# Patient Record
Sex: Female | Born: 1985 | Race: White | Hispanic: No | Marital: Married | Smoking: Current every day smoker
Health system: Southern US, Community
[De-identification: ages and names within clinical notes are randomized; demographics above are authoritative.]

## PROBLEM LIST (undated history)

## (undated) ENCOUNTER — Ambulatory Visit: Admission: EM | Payer: BC Managed Care – PPO | Source: Home / Self Care

## (undated) HISTORY — PX: CHOLECYSTECTOMY: SHX55

---

## 2008-01-04 ENCOUNTER — Emergency Department: Payer: Self-pay | Admitting: Internal Medicine

## 2008-07-03 ENCOUNTER — Emergency Department: Payer: Self-pay | Admitting: Internal Medicine

## 2009-04-10 ENCOUNTER — Emergency Department: Payer: Self-pay | Admitting: Emergency Medicine

## 2009-09-15 ENCOUNTER — Emergency Department: Payer: Self-pay | Admitting: Emergency Medicine

## 2010-09-29 ENCOUNTER — Emergency Department: Payer: Self-pay | Admitting: Unknown Physician Specialty

## 2012-05-18 ENCOUNTER — Emergency Department: Payer: Self-pay | Admitting: Emergency Medicine

## 2012-05-18 LAB — PREGNANCY, URINE: Pregnancy Test, Urine: NEGATIVE m[IU]/mL

## 2013-01-09 ENCOUNTER — Ambulatory Visit: Payer: Self-pay | Admitting: Advanced Practice Midwife

## 2013-02-27 ENCOUNTER — Observation Stay: Payer: Self-pay | Admitting: Obstetrics & Gynecology

## 2013-02-27 LAB — FETAL FIBRONECTIN
Appearance: NORMAL
Fetal Fibronectin: NEGATIVE

## 2013-03-26 ENCOUNTER — Observation Stay: Payer: Self-pay

## 2013-04-17 ENCOUNTER — Observation Stay: Payer: Self-pay | Admitting: Obstetrics and Gynecology

## 2013-05-01 ENCOUNTER — Emergency Department: Payer: Self-pay | Admitting: Emergency Medicine

## 2013-06-01 ENCOUNTER — Observation Stay: Payer: Self-pay | Admitting: Obstetrics & Gynecology

## 2013-06-07 ENCOUNTER — Observation Stay: Payer: Self-pay

## 2013-06-07 ENCOUNTER — Inpatient Hospital Stay: Payer: Self-pay | Admitting: Obstetrics and Gynecology

## 2013-06-07 LAB — CBC WITH DIFFERENTIAL/PLATELET
Basophil #: 0 10*3/uL (ref 0.0–0.1)
Eosinophil #: 0.1 10*3/uL (ref 0.0–0.7)
HGB: 12.6 g/dL (ref 12.0–16.0)
Lymphocyte #: 1.9 10*3/uL (ref 1.0–3.6)
Lymphocyte %: 13.5 %
MCH: 28.9 pg (ref 26.0–34.0)
MCHC: 33.9 g/dL (ref 32.0–36.0)
MCV: 85 fL (ref 80–100)
Neutrophil #: 10.9 10*3/uL — ABNORMAL HIGH (ref 1.4–6.5)
Neutrophil %: 76.7 %
Platelet: 210 10*3/uL (ref 150–440)
RBC: 4.36 10*6/uL (ref 3.80–5.20)
WBC: 14.3 10*3/uL — ABNORMAL HIGH (ref 3.6–11.0)

## 2013-06-10 LAB — HEMATOCRIT: HCT: 32.5 % — ABNORMAL LOW (ref 35.0–47.0)

## 2013-06-23 ENCOUNTER — Emergency Department: Payer: Self-pay | Admitting: Emergency Medicine

## 2013-07-27 ENCOUNTER — Emergency Department: Payer: Self-pay | Admitting: Emergency Medicine

## 2013-09-14 ENCOUNTER — Emergency Department: Payer: Self-pay | Admitting: Emergency Medicine

## 2013-09-14 LAB — COMPREHENSIVE METABOLIC PANEL
Albumin: 3.5 g/dL (ref 3.4–5.0)
Alkaline Phosphatase: 189 U/L — ABNORMAL HIGH (ref 50–136)
Anion Gap: 5 — ABNORMAL LOW (ref 7–16)
BUN: 3 mg/dL — ABNORMAL LOW (ref 7–18)
Bilirubin,Total: 3.1 mg/dL — ABNORMAL HIGH (ref 0.2–1.0)
Chloride: 106 mmol/L (ref 98–107)
Co2: 26 mmol/L (ref 21–32)
Creatinine: 0.73 mg/dL (ref 0.60–1.30)
EGFR (African American): >60
EGFR (Non-African Amer.): >60
Glucose: 110 mg/dL — ABNORMAL HIGH (ref 65–99)
Osmolality: 271 (ref 275–301)
SGOT(AST): 133 U/L — ABNORMAL HIGH (ref 15–37)
SGPT (ALT): 177 U/L — ABNORMAL HIGH (ref 12–78)
Sodium: 137 mmol/L (ref 136–145)

## 2013-09-14 LAB — CBC
HCT: 40.4 % (ref 35.0–47.0)
HGB: 14.2 g/dL (ref 12.0–16.0)
MCHC: 35.2 g/dL (ref 32.0–36.0)
Platelet: 259 10*3/uL (ref 150–440)
RBC: 4.73 10*6/uL (ref 3.80–5.20)
RDW: 14.3 % (ref 11.5–14.5)
WBC: 9.1 10*3/uL (ref 3.6–11.0)

## 2013-09-14 LAB — URINALYSIS, COMPLETE: Ph: 6 (ref 4.5–8.0)

## 2013-09-14 LAB — LIPASE, BLOOD: Lipase: 283 U/L (ref 73–393)

## 2013-09-14 LAB — PREGNANCY, URINE: Pregnancy Test, Urine: NEGATIVE m[IU]/mL

## 2013-10-11 ENCOUNTER — Ambulatory Visit: Payer: Self-pay | Admitting: Surgery

## 2013-10-11 LAB — HEPATIC FUNCTION PANEL A (ARMC)
Bilirubin, Direct: <0.1 mg/dL (ref 0.00–0.20)
SGPT (ALT): 31 U/L (ref 12–78)
Total Protein: 7.5 g/dL (ref 6.4–8.2)

## 2013-10-11 LAB — POTASSIUM: Potassium: 3.3 mmol/L — ABNORMAL LOW (ref 3.5–5.1)

## 2013-10-15 ENCOUNTER — Ambulatory Visit: Payer: Self-pay | Admitting: Surgery

## 2013-10-16 LAB — PATHOLOGY REPORT

## 2014-01-09 ENCOUNTER — Emergency Department: Payer: Self-pay | Admitting: Emergency Medicine

## 2014-01-13 ENCOUNTER — Emergency Department: Payer: Self-pay | Admitting: Emergency Medicine

## 2014-01-14 ENCOUNTER — Emergency Department: Payer: Self-pay | Admitting: Emergency Medicine

## 2014-04-20 ENCOUNTER — Emergency Department: Payer: Self-pay | Admitting: Emergency Medicine

## 2015-01-08 IMAGING — US US OB US >=[ID] SNGL FETUS
1 series · 14 of 28 positions shown · non-contrast
Comparison: none

REASON FOR EXAM: For dates
COMMENTS:

[Series 1: us ob us >=(id) sngl fetus · 0.26mm/px · 14 of 78 slices shown]
[im 3/78]
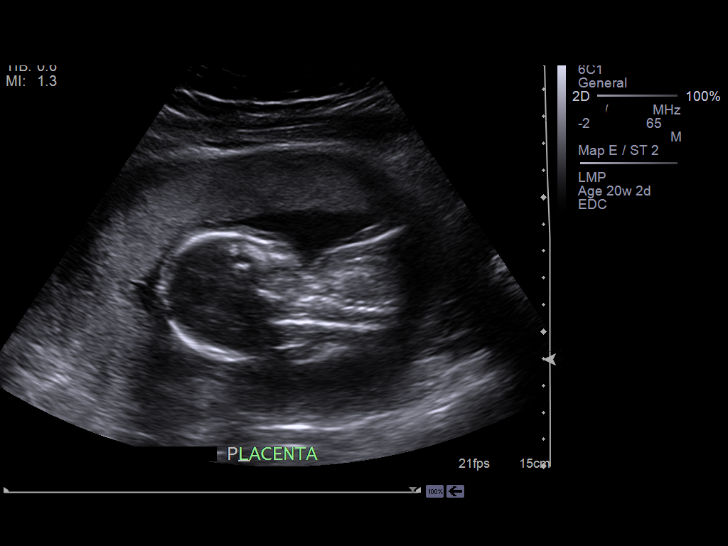
[im 9/78]
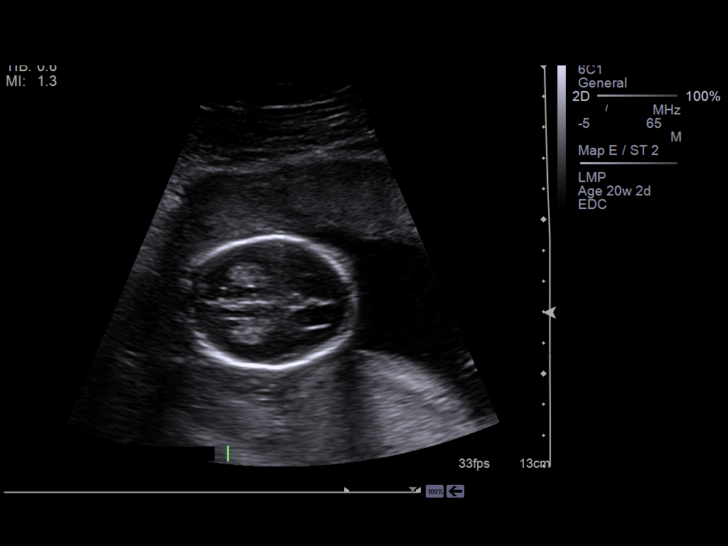
[im 15/78]
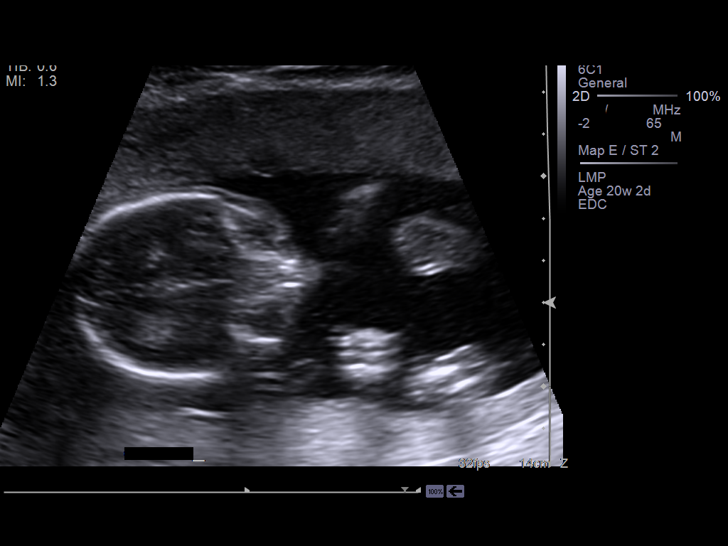
[im 20/78]
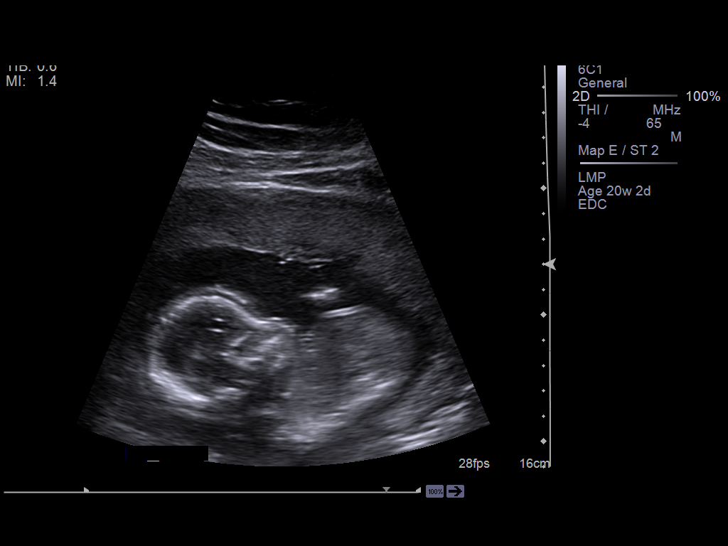
[im 26/78]
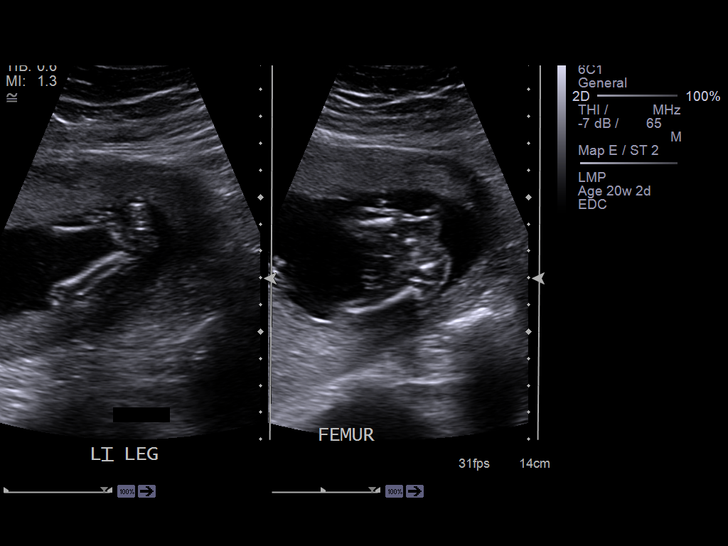
[im 32/78]
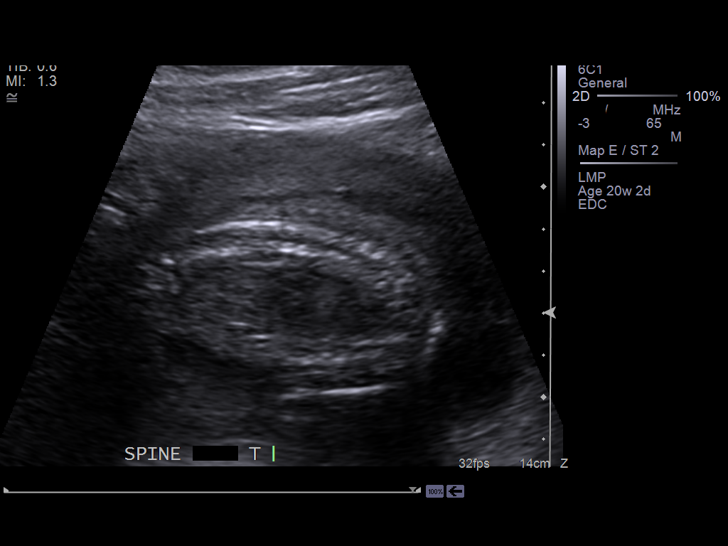
[im 38/78]
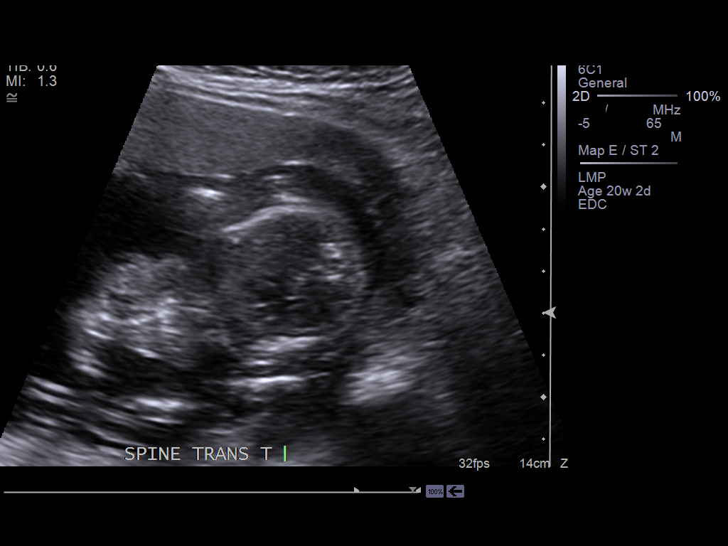
[im 43/78]
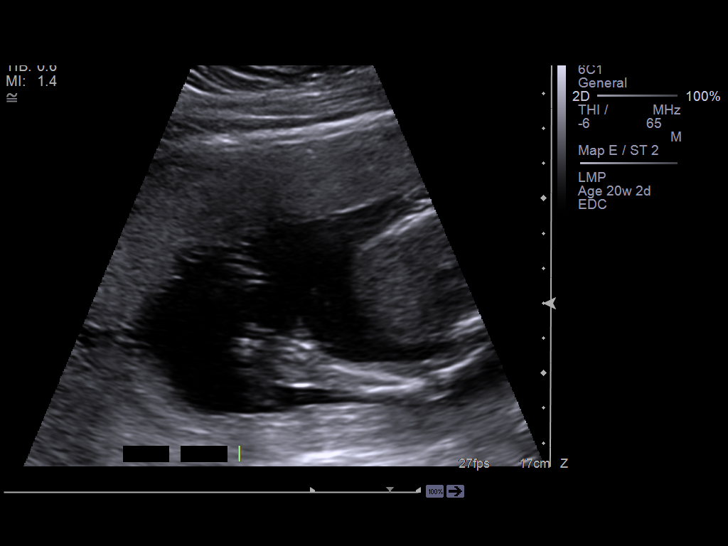
[im 49/78]
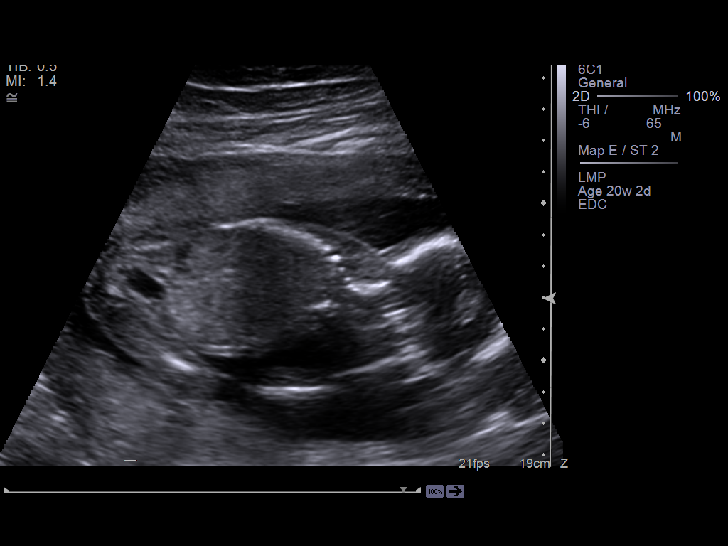
[im 55/78]
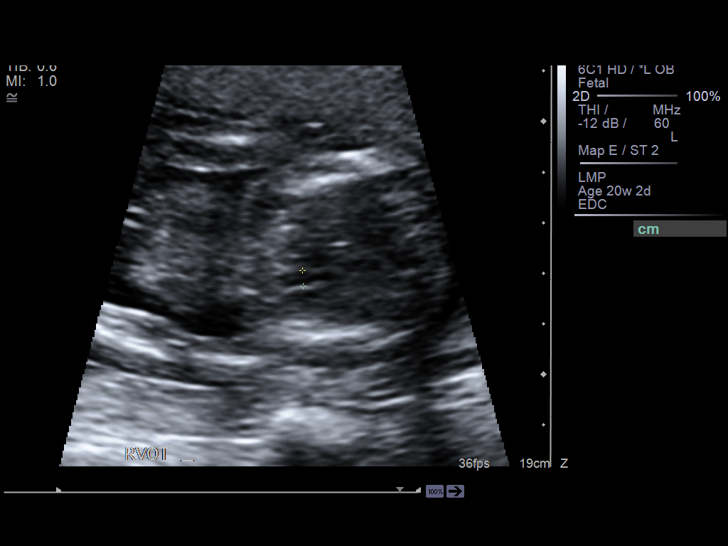
[im 60/78]
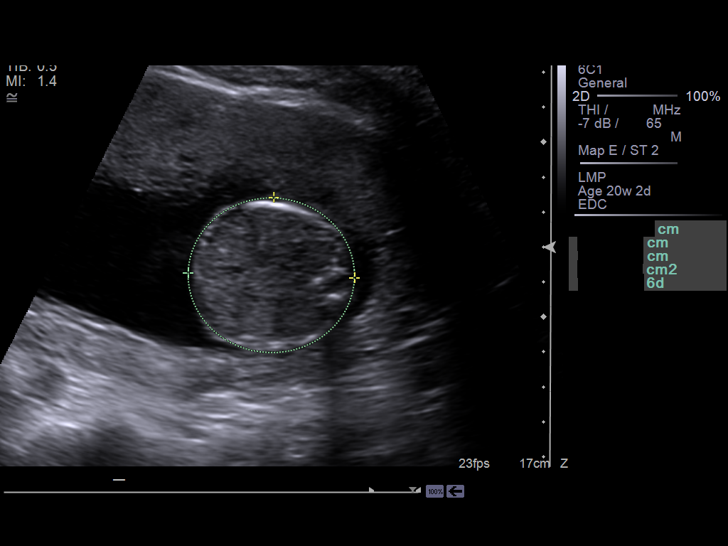
[im 66/78]
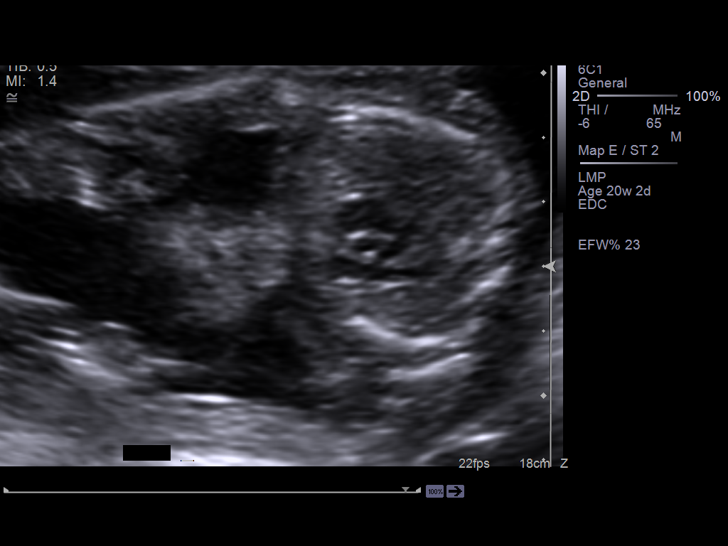
[im 72/78]
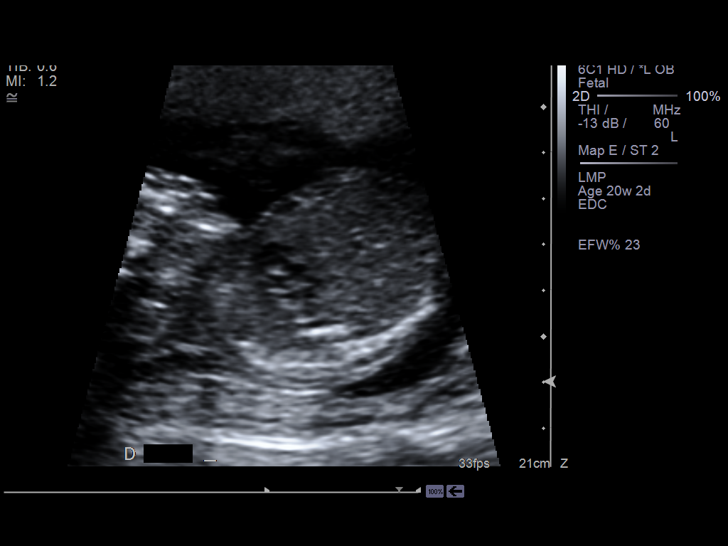
[im 78/78]
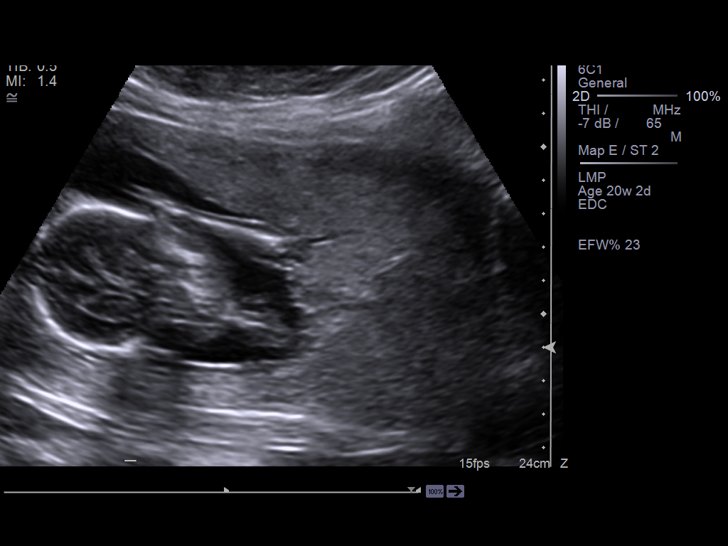

[14 of 28 positions shown; findings below may reference images not displayed]

PROCEDURE:     US  - US OB GREATER/OR EQUAL TO [id number]  - January 09, 2013  [DATE]

RESULT:     Single viable intrauterine pregnancy with active cardiac, trunk,
extremities, diaphragm movement. Fetal presentation is variable. Placenta is
anterior and 4.1 cm from the cervix. Amniotic fluid volume appears normal.
Fetal parts appear normal. If or there are any complicating features of this
pregnancy ultrasound at a tertiary care center suggested. Gestational age
parameters correlate well and correspond to a mean gestational age by
ultrasound of 19 weeks 5 days.
IMPRESSION: Single viable pregnancy at 19 weeks 5 days.

## 2015-04-11 NOTE — H&P (Signed)
PATIENT NAME:  Dana Mckinney, Dana Mckinney MR#:  [id number] DATE OF BIRTH:  1986/11/02  DATE OF ADMISSION:  10/15/2013  CHIEF COMPLAINT: Epigastric pain.   HISTORY OF PRESENT ILLNESS: This is a patient with recurrent episodes of right upper quadrant pain, associated with fatty food intolerance, fairly classic symptoms of gallbladder disease with biliary colic. She has had nausea, fatty food intolerance, no jaundice or acholic stools. Her liver function tests are within normal limits.   PAST MEDICAL HISTORY: None.   PAST SURGICAL HISTORY: None.   ALLERGIES: NONE.   MEDICATIONS: Oxycodone and OCP.   FAMILY HISTORY: Noncontributory.   SOCIAL HISTORY: The patient smokes and is obese.   REVIEW OF SYSTEMS: Ten-system review has been performed and negative with the exception of that mentioned in the history of present illness.   PHYSICAL EXAMINATION:  GENERAL: Obese female patient.  HEENT: No scleral icterus.  NECK: No palpable neck nodes.  CHEST: Clear to auscultation.  CARDIAC: Regular rate and rhythm.  ABDOMEN: Soft and nontender. No scars are noted.  EXTREMITIES: Without edema.  NEUROLOGIC: Grossly intact.  INTEGUMENT: No jaundice.   LABORATORY VALUES: Demonstrate normal liver function tests. Ultrasound shows stones.   ASSESSMENT AND PLAN: This is a patient with symptomatic cholelithiasis, here for elective laparoscopic cholecystectomy. I discussed with her the options of observation versus the risks of bleeding, infection, recurrence of symptoms, failure to resolve her symptoms, open procedure, bile duct damage, bile duct leak, retained common bile duct stone, any of which could require further surgery and/or ERCP, stent and papillotomy.   I also discussed with her her obesity, smoking and OCP use in conjunction with general anesthetic, and the risk for developing a pulmonary embolus or venous thrombosis. We will take appropriate action.  I have asked her to stop smoking, and will review that  with her in the preop holding area. She is at high risk for deep vein thrombosis and pleural effusion and will need prophylaxis. Prophylaxis has been ordered. She understood and agreed to proceed with this plan.   ____________________________ Adah Salvageichard E. Excell Seltzerooper, MD rec:cg D: 10/14/2013 20:46:44 ET T: 10/14/2013 23:48:41 ET JOB#: [id number]  cc: Adah Salvageichard E. Excell Seltzerooper, MD, <Dictator> Lattie HawICHARD E Riko Lumsden MD ELECTRONICALLY SIGNED 10/15/2013 7:42

## 2015-04-11 NOTE — Op Note (Signed)
PATIENT NAME:  Dana Mckinney, [name] MR#:  [id number] DATE OF BIRTH:  [birth year]  DATE OF PROCEDURE:  10/15/2013  PREOPERATIVE DIAGNOSIS: Symptomatic cholelithiasis.   POSTOPERATIVE DIAGNOSIS: Symptomatic cholelithiasis.   PROCEDURE: Laparoscopic cholecystectomy.   SURGEON: Dionne Miloichard Cooper, M.D.   ASSISTANT: Jules SchickBrittany James, PA-S.   INDICATIONS: This is a patient with recurrent epigastric and right upper quadrant pain associated with fatty food intolerance and workup showing gallstones. Preoperatively, we discussed rationale for surgery, the options of observation, risk of bleeding, infection, recurrence of symptoms, failure to resolve all of her symptoms, open procedure, bile duct damage, bile duct leak, retained common bile duct stone, any of which could require further surgery and/or ERCP, stent and papillotomy. This has all been reviewed for her in the preop holding area. She understood and agreed to proceed.   FINDINGS: Small gallstones, fairly large cystic duct.   DESCRIPTION OF PROCEDURE: The patient was induced to general anesthesia, given IV antibiotics. VTE prophylaxis was in place. She was prepped and draped in a sterile fashion. Marcaine was infiltrated in skin and subcutaneous tissues around the periumbilical area. An incision was made. Veress needle was placed. Pneumoperitoneum was obtained, and a 5 mm trocar port was placed. The abdominal cavity was explored, and under direct vision, a 10 mm epigastric port and 2 lateral 5 mm ports were placed. The gallbladder was placed on tension. The peritoneum over the infundibulum was incised bluntly. The cystic lymphatics were doubly clipped and divided. This allowed for good visualization of the cystic duct as I entered the infundibulum of the gallbladder. Small stones within the infundibulum were milked back away from the cystic duct infundibular junction. The cystic duct was doubly clipped and divided, and the gallbladder was taken from the gallbladder  fossa with electrocautery. The cystic artery was doubly clipped and divided, and a lateral vessel was doubly clipped and divided as well, and the gallbladder was passed out through the epigastric port site with the aid of an Endo Catch bag. The area was checked for hemostasis and found to be adequate. There was no sign of bleeding, bile leak or bowel injury. The camera was placed in the epigastric site. There was no sign of bowel injury or adhesions in the pelvis or near the periumbilical insertion site. Again, no sign of bleeding, bile leak or bowel injury; therefore, pneumoperitoneum was released. All ports were removed. Fascial edges at the epigastric site were approximated with 0 Vicryl figure-of-eight sutures, and then 4-0 subcuticular Monocryl was used on all skin edges. Steri-Strips, Mastisol and sterile dressings were placed.   The patient tolerated the procedure well. There were no complications. She was taken to the recovery room in stable condition to be discharged in the care of her family.  Follow up in 10 days.     ____________________________ Adah Salvageichard E. Excell Seltzerooper, MD rec:dmm D: 10/15/2013 14:10:20 ET T: 10/15/2013 21:31:25 ET JOB#: [id number]  cc: Adah Salvageichard E. Excell Seltzerooper, MD, <Dictator> Lattie HawICHARD E COOPER MD ELECTRONICALLY SIGNED 10/16/2013 9:02

## 2015-04-29 NOTE — H&P (Signed)
L&D Evaluation:  History Expanded:  HPI 29 yo G1 now 5634 2/[redacted]weeks gestation with EDC=05/27/2013 by LMP verified by US. Last night she had some pains in her lower abdomen along the pubic bone and she went to sleep and this morning went to work and she had some worsening pain through out the day. she had two days of diarrhea and a milky white DC. she read her book that said those are signs of labor and she was encouraged by her mom to come in. Denies vaginal bleeding or LOF. 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  A+, RPR NR, R has had the vaccines in 89 and 92,, HBsAg neg, GBS unk, glucola early was 94/ 106, VZI   Gravida 1   Term 0   PreTerm 0   Abortion 0   Living 0   Blood Type (Maternal) A positive   Group B Strep Results Maternal (Result >5wks must be treated as unknown) unknown/result > 5 weeks ago   Maternal HIV Negative   Maternal Syphilis Ab Nonreactive   Maternal Varicella Immune   Rubella Results (Maternal) had vaCCINES IN 89 AND 92   Maternal T-Dap Unknown   [facility]   Presents with abdominal pain   Patient's Medical History obesity, HSIL on colpo   Patient's Surgical History none   Medications Pre Natal Vitamins   Allergies NKDA   Social History tobacco   Family History Non-Contributory   ROS:  ROS see HPI   Exam:  Vital Signs stable  115/64   General no apparent distress   Mental Status clear   Chest clear   Heart normal sinus rhythm   Abdomen gravid, non-tender   Fetal Position ballotable-V?   Back no CVAT   Edema no edema   Pelvic no external lesions, cervix closed and thick   Mebranes Intact, AFI=12.1 cm   FHT baseline 135 with accels to 150s   FHT Description CAT 1   Ucx absent   Skin dry   Other milky dc wet prep normalflora   Impression:  Impression reactive NST, IUP at 34 2/7 weeks with reactive NST, pubic bone pain, discomforts of pregnancy   Plan:  Plan [facility]. Report decreased FM. FU at [facility] as scheduled   Comments Get belly band to support abdomen   Follow Up Appointment already scheduled. next monday   Electronic Signatures: Adria DevonKlett, Joffre Lucks (MD)  (Signed 29-Apr-14 20:19)  Authored: L&D Evaluation   Last Updated: 29-Apr-14 20:19 by Adria DevonKlett, Reda Gettis (MD)

## 2015-04-29 NOTE — H&P (Signed)
L&D Evaluation:  History:  HPI 29 yo G1 at @ 6917w7d gestational age by LMP.  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  She presents with en episode of vaginal spotting x 2 spots last night. Since early this morning she has been having some cramping symptoms in her bilateral sides.  She notes positive fetal movement, denies leakage of fluid.   A+, RPR NR, RI, HBsAg neg, GBS unk   Patient's Medical History obesity   Patient's Surgical History none   Medications Pre Natal Vitamins   Allergies NKDA   Social History tobacco   Family History Non-Contributory   ROS:  ROS All systems were reviewed.  HEENT, CNS, GI, GU, Respiratory, CV, Renal and Musculoskeletal systems were found to be normal., unless noted in HPI (Denies urinary, GI, vaginal symptoms, denies fevers, chills)   Exam:  Vital Signs stable   General no apparent distress   Mental Status clear   Chest clear   Heart normal sinus rhythm   Abdomen gravid, non-tender   Back no CVAT   Edema no edema   Pelvic no external lesions, cervix closed and thick   Mebranes Intact   Skin no lesions   Impression:  Impression abdominal cramping   Plan:  Plan UA   Comments - fFN - wet prep -UA   Electronic Signatures: Conard NovakJackson, Stephen D (MD)  (Signed 11-Mar-14 12:54)  Authored: L&D Evaluation   Last Updated: 11-Mar-14 12:54 by Conard NovakJackson, Stephen D (MD)

## 2015-04-29 NOTE — H&P (Signed)
L&D Evaluation:  History Expanded:  HPI 29 yo G1 with EDC=05/31/2013 by 19 week US, presents at 41 2/7 weeks for NST on 06/07/13. Set up for IOL on 6/21 pm. +FM. Denies vaginal bleeding or LOF. PNC at [facility]. 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  A+, RPR NR, RI, HBsAg neg, GBS positive   Group B Strep Results Maternal (Result >5wks must be treated as unknown) positive   Patient's Medical History obesity   Patient's Surgical History none   Medications Pre Natal Vitamins  Iron   Allergies NKDA   Social History tobacco  1 cig/day   Family History Non-Contributory   ROS:  ROS see HPI   Exam:  Vital Signs stable   General no apparent distress   Mental Status clear   Chest clear   Heart no murmur/gallop/rubs   Abdomen gravid, non-tender   Estimated Fetal Weight EFW: 8 lbs   Fetal Position vertex   Pelvic no external lesions, 1/long/-3   Mebranes Intact   FHT normal rate with no decels, category 1 tracing   Ucx absent   Skin dry   Impression:  Impression postdates   Plan:  Comments IOL scheduled for 6/21 at 8 pm. Unit will call pt if able to be scheduled sooner. Risks (tachysytole, FITL, C-section) and benefits (avoiding risks of post-dates pregnancy) of IOL discussed with pt  who agrees with plan for Cervidil cervical ripening and Pitocin or other methods prn. Labor precautions.   Electronic Signatures: Vella KohlerBrothers, Selda Jalbert K (CNM)  (Signed 19-Jun-14 08:44)  Authored: L&D Evaluation   Last Updated: 19-Jun-14 08:44 by Vella KohlerBrothers, Raimundo Corbit K (CNM)

## 2015-04-29 NOTE — H&P (Signed)
L&D Evaluation:  History:  HPI 29 yo G1 now 30 4/[redacted] weeks gestation with EDC=05/31/2013 by ultrasound sent from [facility] for NST due to decreased FM x 2 days. Denies vaginal bleeding or LOF. Seen one month ago in L&D for vaginal spotting. 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  A+, RPR NR, RI, HBsAg neg, GBS unk   Presents with decreased fetal movement   Patient's Medical History obesity   Patient's Surgical History none   Medications Pre Natal Vitamins   Allergies NKDA   Social History tobacco   Family History Non-Contributory   ROS:  ROS see HPI   Exam:  Vital Signs stable  115/64   General no apparent distress   Mental Status clear   Abdomen gravid, non-tender   Fetal Position breech on ultrasound with an anterior placenta   Mebranes Intact, AFI=12.1 cm   FHT baseline 135 with accels to 150s   FHT Description CAT 1   Ucx absent   Skin dry   Impression:  Impression IUP at 30 4/7 weeks with reactive NST, normal AFI   [facility]  [facility]. Report decreased FM. FU at [facility] as scheduled   Electronic Signatures: Trinna BalloonGutierrez, Jiana Lemaire L (CNM)  (Signed 07-Apr-14 15:59)  Authored: L&D Evaluation   Last Updated: 07-Apr-14 15:59 by Trinna BalloonGutierrez, Doron Shake L (CNM)

## 2015-12-16 ENCOUNTER — Emergency Department
Admission: EM | Admit: 2015-12-16 | Discharge: 2015-12-16 | Disposition: A | Discharge status: Home / Self Care | Payer: BLUE CROSS/BLUE SHIELD | Attending: Emergency Medicine | Admitting: Emergency Medicine

## 2015-12-16 DIAGNOSIS — F17210 Nicotine dependence, cigarettes, uncomplicated: Secondary | ICD-10-CM | POA: Insufficient documentation

## 2015-12-16 DIAGNOSIS — K029 Dental caries, unspecified: Secondary | ICD-10-CM | POA: Insufficient documentation

## 2015-12-16 DIAGNOSIS — K0889 Other specified disorders of teeth and supporting structures: Secondary | ICD-10-CM | POA: Diagnosis present

## 2015-12-16 MED ORDER — AMOXICILLIN 500 MG PO CAPS: 500.0000 mg | ORAL_CAPSULE | Freq: Three times a day (TID) | ORAL | Status: DC

## 2015-12-16 MED ORDER — TRAMADOL HCL 50 MG PO TABS: 50.0000 mg | ORAL_TABLET | Freq: Four times a day (QID) | ORAL | Status: DC | PRN

## 2015-12-16 MED ORDER — IBUPROFEN 800 MG PO TABS: 800.0000 mg | ORAL_TABLET | Freq: Three times a day (TID) | ORAL | Status: DC | PRN

## 2015-12-16 NOTE — ED Notes (Signed)
Pt c/o right upper tooth pain for several weeks, this morning woke with swelling to the right side of face

## 2015-12-16 NOTE — ED Notes (Signed)
Pt discharged home after verbalizing understanding of discharge instructions; nad noted. 

## 2015-12-16 NOTE — ED Provider Notes (Signed)
[facility] Emergency Department Provider Note  ____________________________________________  Time seen: Approximately 12:42 PM  I have reviewed the triage vital signs and the nursing notes.   HISTORY  Chief Complaint Dental Pain    HPI Dana Mckinney is a 29 y.o. female is complaining of dental pain secondary to devitalize teeth in the upper right molar area. Patient is scheduled for extractions January 2017 and a plate replacement. Patient states she is having dental pain for several weeks and the last 3 days she's nose increased swelling. Patient rates the pain as 8/10 described as sharp. No palliative measures taken for this complaint.   History reviewed. No pertinent past medical history.  There are no active problems to display for this patient.   Past Surgical History  Procedure Laterality Date  . Cholecystectomy      Current Outpatient Rx  Name  Route  Sig  Dispense  Refill  . amoxicillin (AMOXIL) 500 MG capsule   Oral   Take 1 capsule (500 mg total) by mouth 3 (three) times daily.   30 capsule   0   . ibuprofen (ADVIL,MOTRIN) 800 MG tablet   Oral   Take 1 tablet (800 mg total) by mouth every 8 (eight) hours as needed.   30 tablet   0   . traMADol (ULTRAM) 50 MG tablet   Oral   Take 1 tablet (50 mg total) by mouth every 6 (six) hours as needed.   20 tablet   0     Allergies Review of patient's allergies indicates no known allergies.  No family history on file.  Social History Social History  Substance Use Topics  . Smoking status: Current Every Day Smoker    Types: Cigarettes  . Smokeless tobacco: None  . Alcohol Use: No    Review of Systems Constitutional: No fever/chills Eyes: No visual changes. ENT: No sore throat. Dental pain Cardiovascular: Denies chest pain. Respiratory: Denies shortness of breath. Gastrointestinal: No abdominal pain.  No nausea, no vomiting.  No diarrhea.  No constipation. Genitourinary:  Negative for dysuria. Musculoskeletal: Negative for back pain. Skin: Negative for rash. Neurological: Negative for headaches, focal weakness or numbness. 10-point ROS otherwise negative.  ____________________________________________   PHYSICAL EXAM:  VITAL SIGNS: ED Triage Vitals  Enc Vitals Group     BP 12/16/15 1148 135/86 mmHg     Pulse Rate 12/16/15 1148 87     Resp 12/16/15 1148 16     Temp 12/16/15 1148 98.2 F (36.8 C)     Temp Source 12/16/15 1148 Oral     SpO2 12/16/15 1148 97 %     Weight 12/16/15 1148 252 lb (114.306 kg)     Height 12/16/15 1148 5\' 11"  (1.803 m)     Head Cir --      Peak Flow --      Pain Score 12/16/15 1149 8     Pain Loc --      Pain Edu? --      Excl. in GC? --     Constitutional: Alert and oriented. Well appearing and in no acute distress. Eyes: Conjunctivae are normal. PERRL. EOMI. Head: Atraumatic. Nose: No congestion/rhinnorhea. Mouth/Throat: Mucous membranes are moist.  Oropharynx non-erythematous. Multiple fractures and caries of the right upper molars. Mild gingival edema but no erythema. Neck: No stridor. No cervical spine tenderness to palpation. Hematological/Lymphatic/Immunilogical: No cervical lymphadenopathy. Cardiovascular: Normal rate, regular rhythm. Grossly normal heart sounds.  Good peripheral circulation. Respiratory: Normal respiratory effort.  No retractions. Lungs CTAB. Gastrointestinal: Soft and nontender. No distention. No abdominal bruits. No CVA tenderness. Musculoskeletal: No lower extremity tenderness nor edema.  No joint effusions. Neurologic:  Normal speech and language. No gross focal neurologic deficits are appreciated. No gait instability. Skin:  Skin is warm, dry and intact. No rash noted. Psychiatric: Mood and affect are normal. Speech and behavior are normal.  ____________________________________________   LABS (all labs ordered are listed, but only abnormal results are displayed)  Labs Reviewed - No  data to display ____________________________________________  EKG   ____________________________________________  RADIOLOGY   ____________________________________________   PROCEDURES  Procedure(s) performed: None  Critical Care performed: No  ____________________________________________   INITIAL IMPRESSION / ASSESSMENT AND PLAN / ED COURSE  Pertinent labs & imaging results that were available during my care of the patient were reviewed by me and considered in my medical decision making (see chart for details).  Dental pain secondary to caries. Patient follow-up with scheduled dental appointment. Patient given prescription for amoxicillin, tramadol, and ibuprofen. ____________________________________________   FINAL CLINICAL IMPRESSION(S) / ED DIAGNOSES  Final diagnoses:  Pain due to dental caries      Joni Reining, PA-C 12/16/15 1250  Emily Filbert, MD 12/16/15 1351

## 2015-12-16 NOTE — ED Notes (Signed)
Pt reports that she is supposed to have her remaining upper teeth taken out at the end of the month and a plate to replace them, but the pain is just too much for her to handle right now. She has had pain x 2 days and has some swelling to the right side of her face and mouth. Pt reports "pocket" in her upper gums. Pt alert & oriented with nad noted.

## 2016-11-23 ENCOUNTER — Emergency Department: Payer: BLUE CROSS/BLUE SHIELD

## 2016-11-23 ENCOUNTER — Emergency Department
Admission: EM | Admit: 2016-11-23 | Discharge: 2016-11-23 | Disposition: A | Discharge status: Home / Self Care | Payer: BLUE CROSS/BLUE SHIELD | Attending: Emergency Medicine | Admitting: Emergency Medicine

## 2016-11-23 ENCOUNTER — Encounter: Payer: Self-pay | Admitting: Emergency Medicine

## 2016-11-23 DIAGNOSIS — Y999 Unspecified external cause status: Secondary | ICD-10-CM | POA: Insufficient documentation

## 2016-11-23 DIAGNOSIS — Y92410 Unspecified street and highway as the place of occurrence of the external cause: Secondary | ICD-10-CM | POA: Diagnosis not present

## 2016-11-23 DIAGNOSIS — S0990XA Unspecified injury of head, initial encounter: Secondary | ICD-10-CM | POA: Diagnosis present

## 2016-11-23 DIAGNOSIS — F17210 Nicotine dependence, cigarettes, uncomplicated: Secondary | ICD-10-CM | POA: Diagnosis not present

## 2016-11-23 DIAGNOSIS — Y939 Activity, unspecified: Secondary | ICD-10-CM | POA: Insufficient documentation

## 2016-11-23 DIAGNOSIS — M542 Cervicalgia: Secondary | ICD-10-CM | POA: Diagnosis not present

## 2016-11-23 DIAGNOSIS — R51 Headache: Secondary | ICD-10-CM | POA: Insufficient documentation

## 2016-11-23 MED ORDER — TRAMADOL HCL 50 MG PO TABS: 50.0000 mg | ORAL_TABLET | Freq: Four times a day (QID) | ORAL | 0 refills | Status: AC | PRN

## 2016-11-23 MED ORDER — TRAMADOL HCL 50 MG PO TABS
50.0000 mg | ORAL_TABLET | Freq: Once | ORAL | Status: AC
Start: 2016-11-23 — End: 2016-11-23
  Administered 2016-11-23: 50 mg via ORAL
  Filled 2016-11-23: qty 1
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
## 2018-08-11 ENCOUNTER — Emergency Department
Admission: EM | Admit: 2018-08-11 | Discharge: 2018-08-11 | Disposition: A | Discharge status: Home / Self Care | Payer: BLUE CROSS/BLUE SHIELD | Attending: Emergency Medicine | Admitting: Emergency Medicine

## 2018-08-11 ENCOUNTER — Encounter: Payer: Self-pay | Admitting: Emergency Medicine

## 2018-08-11 ENCOUNTER — Other Ambulatory Visit: Payer: Self-pay

## 2018-08-11 DIAGNOSIS — N61 Mastitis without abscess: Secondary | ICD-10-CM | POA: Diagnosis not present

## 2018-08-11 DIAGNOSIS — F17210 Nicotine dependence, cigarettes, uncomplicated: Secondary | ICD-10-CM | POA: Insufficient documentation

## 2018-08-11 DIAGNOSIS — L03818 Cellulitis of other sites: Secondary | ICD-10-CM

## 2018-08-11 DIAGNOSIS — N644 Mastodynia: Secondary | ICD-10-CM | POA: Diagnosis present

## 2018-08-11 MED ORDER — CLINDAMYCIN HCL 300 MG PO CAPS: 300.0000 mg | ORAL_CAPSULE | Freq: Three times a day (TID) | ORAL | 0 refills | Status: AC

## 2018-08-11 MED ORDER — CLINDAMYCIN HCL 150 MG PO CAPS
300.0000 mg | ORAL_CAPSULE | Freq: Once | ORAL | Status: AC
  Administered 2018-08-11: 300 mg via ORAL
  Filled 2018-08-11: qty 2

## 2018-08-11 MED ORDER — FLUCONAZOLE 150 MG PO TABS: 150.0000 mg | ORAL_TABLET | Freq: Once | ORAL | 0 refills | Status: AC

## 2018-08-11 NOTE — ED Triage Notes (Signed)
First Nurse Note: C/O "knot to right breast" x 1 week.  AAOx3.  Skin warm and dry. NAD

## 2018-08-11 NOTE — ED Notes (Signed)
Reviewed discharge instructions, follow-up care, and prescriptions with patient. Patient verbalized understanding of all information reviewed. Patient stable, with no distress noted at this time.    

## 2018-08-11 NOTE — Discharge Instructions (Addendum)
Take the antibiotic as directed. Monitor for any changes to the area. Return to the Emergency Department if needed. See your GYN provider or [facility] for recheck.

## 2018-08-11 NOTE — ED Triage Notes (Signed)
Pt ambulatory to triage with no difficulty. Pt reports she has an area on the bottom side of her right breast that is swollen and red with purulent drainage at times. First noticed about 1 weeks ago.

## 2018-08-12 NOTE — ED Provider Notes (Signed)
[facility] Emergency Department Provider Note ____________________________________________  Time seen: 2319  I have reviewed the triage vital signs and the nursing notes.  HISTORY  Chief Complaint  Abscess  HPI Dana Mckinney is a 32 y.o. female presents to the ED accompanied by her husband, for evaluation of tenderness and redness to the right breast.  Patient would describe about a 4-week complaint of redness and tenderness to the right breast on the underside.  She admits to poking a small pustule that was there initially, before the area became more inflamed.  She presents now with some discoloration to the scan but denies any spontaneous purulent drainage fevers, chills, or sweats. The patient also denies any nipple discharge or bleeding.  History reviewed. No pertinent past medical history.  There are no active problems to display for this patient.   Past Surgical History:  Procedure Laterality Date  . CHOLECYSTECTOMY      Prior to Admission medications   Medication Sig Start Date End Date Taking? Authorizing Provider  clindamycin (CLEOCIN) 300 MG capsule Take 1 capsule (300 mg total) by mouth 3 (three) times daily for 10 days. 08/11/18 08/21/18  Seriah Brotzman, Charlesetta Ivory, PA-C   Allergies Patient has no known allergies.  History reviewed. No pertinent family history.  Social History Social History   Tobacco Use  . Smoking status: Current Every Day Smoker    Types: Cigarettes  . Smokeless tobacco: Never Used  Substance Use Topics  . Alcohol use: No  . Drug use: Not on file    Review of Systems  Constitutional: Negative for fever. Eyes: Negative for visual changes. ENT: Negative for sore throat. Cardiovascular: Negative for chest pain. Respiratory: Negative for shortness of breath. Gastrointestinal: Negative for abdominal pain, vomiting and diarrhea. Genitourinary: Negative for dysuria. Musculoskeletal: Negative for back pain. Skin:  Negative for rash. Redness to the right breast as above.  Neurological: Negative for headaches, focal weakness or numbness. ____________________________________________  PHYSICAL EXAM:  VITAL SIGNS: ED Triage Vitals  Enc Vitals Group     BP 08/11/18 2159 134/71     Pulse Rate 08/11/18 2159 89     Resp 08/11/18 2159 18     Temp 08/11/18 2159 98.4 F (36.9 C)     Temp Source 08/11/18 2159 Oral     SpO2 08/11/18 2159 99 %     Weight 08/11/18 2200 232 lb (105.2 kg)     Height 08/11/18 2200 5\' 11"  (1.803 m)     Head Circumference --      Peak Flow --      Pain Score 08/11/18 2200 5     Pain Loc --      Pain Edu? --      Excl. in GC? --     Constitutional: Alert and oriented. Well appearing and in no distress. Head: Normocephalic and atraumatic. Eyes: Conjunctivae are normal. Normal extraocular movements Hematological/Lymphatic/Immunological: No axillary or supra-/infraclavicular lymphadenopathy. Cardiovascular: Normal rate, regular rhythm. Normal distal pulses. Respiratory: Normal respiratory effort. No wheezes/rales/rhonchi. Musculoskeletal: Nontender with normal range of motion in all extremities.  Neurologic:  Normal gait without ataxia. Normal speech and language. No gross focal neurologic deficits are appreciated. Skin:  Skin is warm, dry and intact. No rash noted.  Right breast with a focal area about 4 cm in diameter to the onset of the breast with some local erythema.  There is some mild induration noted but no pointing, fluctuance, spontaneous drainage, or focal abscess appreciated. ___________________________________________  PROCEDURES  Procedures Clindamycin 300 mg PO ____________________________________________  INITIAL IMPRESSION / ASSESSMENT AND PLAN / ED COURSE  Patient with ED evaluation of right breast cellulitis without indication of a focal abscess on exam.  She will be treated empirically for a non-purulent cellulitis with clindamycin.  She is encouraged  to monitor the area for any changes or worsening.  She will follow-up with her GYN provider, or [facility] for [facility] check as discussed.  Return precautions have been reviewed. ____________________________________________  FINAL CLINICAL IMPRESSION(S) / ED DIAGNOSES  Final diagnoses:  Cellulitis of other specified site      Lissa HoardMenshew, Vung Kush V Bacon, PA-C 08/12/18 0010    Loleta RoseForbach, Cory, MD 08/12/18 [phone]

## 2018-08-30 ENCOUNTER — Emergency Department
Admission: EM | Admit: 2018-08-30 | Discharge: 2018-08-30 | Disposition: A | Discharge status: Home / Self Care | Payer: BLUE CROSS/BLUE SHIELD | Attending: Emergency Medicine | Admitting: Emergency Medicine

## 2018-08-30 ENCOUNTER — Encounter: Payer: Self-pay | Admitting: Emergency Medicine

## 2018-08-30 ENCOUNTER — Other Ambulatory Visit: Payer: Self-pay

## 2018-08-30 DIAGNOSIS — Z23 Encounter for immunization: Secondary | ICD-10-CM | POA: Insufficient documentation

## 2018-08-30 DIAGNOSIS — F17210 Nicotine dependence, cigarettes, uncomplicated: Secondary | ICD-10-CM | POA: Insufficient documentation

## 2018-08-30 DIAGNOSIS — W268XXA Contact with other sharp object(s), not elsewhere classified, initial encounter: Secondary | ICD-10-CM | POA: Diagnosis not present

## 2018-08-30 DIAGNOSIS — S71112A Laceration without foreign body, left thigh, initial encounter: Secondary | ICD-10-CM | POA: Insufficient documentation

## 2018-08-30 DIAGNOSIS — Y9389 Activity, other specified: Secondary | ICD-10-CM | POA: Insufficient documentation

## 2018-08-30 DIAGNOSIS — Y929 Unspecified place or not applicable: Secondary | ICD-10-CM | POA: Diagnosis not present

## 2018-08-30 DIAGNOSIS — Y999 Unspecified external cause status: Secondary | ICD-10-CM | POA: Insufficient documentation

## 2018-08-30 MED ORDER — TETANUS-DIPHTH-ACELL PERTUSSIS 5-2.5-18.5 LF-MCG/0.5 IM SUSP
0.5000 mL | Freq: Once | INTRAMUSCULAR | Status: AC
  Administered 2018-08-30: 0.5 mL via INTRAMUSCULAR
  Filled 2018-08-30: qty 0.5

## 2018-08-30 NOTE — ED Notes (Signed)
See triage note  Presents with superficial laceration to left upper leg  States this happened yesterday  No bleeding noted at present

## 2018-08-30 NOTE — ED Triage Notes (Signed)
Patient states she cut her left upper leg yesterday moving a couch, on a couch spring.  Superficial laceration top of left thigh approx 3/4 inch in length.  No bleeding noted.  Patient states she is here because of bleeding at site this AM.

## 2018-08-30 NOTE — ED Provider Notes (Signed)
[facility] Emergency Department Provider Note   ____________________________________________   First MD Initiated Contact with Patient 08/30/18 0825     (approximate)  I have reviewed the triage vital signs and the nursing notes.   HISTORY  Chief Complaint No chief complaint on file.    HPI Dana Mckinney is a 32 y.o. female laceration left thigh secondary to metal cut while moving a couch yesterday.  Patient stable bleeding is controlled with direct pressure.  Patient denies loss sensation or loss of function.  Denies pain.  Tetanus shot is not up-to-date.  History reviewed. No pertinent past medical history.  There are no active problems to display for this patient.   Past Surgical History:  Procedure Laterality Date  . CHOLECYSTECTOMY      Prior to Admission medications   Medication Sig Start Date End Date Taking? Authorizing Provider  clindamycin (CLEOCIN) 300 MG capsule Take 300 mg by mouth 3 (three) times daily.   Yes [provider]    Allergies Patient has no known allergies.  No family history on file.  Social History Social History   Tobacco Use  . Smoking status: Current Every Day Smoker    Types: Cigarettes  . Smokeless tobacco: Never Used  Substance Use Topics  . Alcohol use: No  . Drug use: Not on file    Review of Systems Constitutional: No fever/chills Eyes: No visual changes. ENT: No sore throat. Cardiovascular: Denies chest pain. Respiratory: Denies shortness of breath. Gastrointestinal: No abdominal pain.  No nausea, no vomiting.  No diarrhea.  No constipation. Genitourinary: Negative for dysuria. Musculoskeletal: Negative for back pain. Skin: Left leg laceration Neurological: Negative for headaches, focal weakness or numbness.   ____________________________________________   PHYSICAL EXAM:  VITAL SIGNS: ED Triage Vitals [08/30/18 0820]  Enc Vitals Group     BP 125/76     Pulse Rate 85    Resp 16     Temp 98.2 F (36.8 C)     Temp Source Oral     SpO2 100 %     Weight 235 lb (106.6 kg)     Height 5\' 11"  (1.803 m)     Head Circumference      Peak Flow      Pain Score 0     Pain Loc      Pain Edu?      Excl. in GC?    Constitutional: Alert and oriented. Well appearing and in no acute distress. Cardiovascular: Normal rate, regular rhythm. Grossly normal heart sounds.  Good peripheral circulation. Respiratory: Normal respiratory effort.  No retractions. Lungs CTAB. Neurologic:  Normal speech and language. No gross focal neurologic deficits are appreciated. No gait instability. Skin: 2.5 cm laceration left thigh.  Psychiatric: Mood and affect are normal. Speech and behavior are normal.  ____________________________________________   LABS (all labs ordered are listed, but only abnormal results are displayed)  Labs Reviewed - No data to display ____________________________________________  EKG   ____________________________________________  RADIOLOGY  ED MD interpretation:    Official radiology report(s): No results found.  ____________________________________________   PROCEDURES  Procedure(s) performed: None  .Marland KitchenLaceration Repair Date/Time: 08/30/2018 8:41 AM Performed by: Joni Reining, PA-C Authorized by: Joni Reining, PA-C   Consent:    Consent obtained:  Verbal   Consent given by:  Patient   Risks discussed:  Infection, pain and poor cosmetic result Anesthesia (see MAR for exact dosages):    Anesthesia method:  None Laceration details:  Location:  Leg   Leg location:  L upper leg   Length (cm):  2.5 Repair type:    Repair type:  Simple Pre-procedure details:    Preparation:  Patient was prepped and draped in usual sterile fashion Exploration:    Contaminated: no   Treatment:    Area cleansed with:  Betadine and saline   Amount of cleaning:  Standard   Visualized foreign bodies/material removed: no   Skin repair:    Repair  method:  Tissue adhesive Approximation:    Approximation:  Close Post-procedure details:    Dressing:  Adhesive bandage   Patient tolerance of procedure:  Tolerated well, no immediate complications    Critical Care performed: No  ____________________________________________   INITIAL IMPRESSION / ASSESSMENT AND PLAN / ED COURSE  As part of my medical decision making, I reviewed the following data within the electronic MEDICAL RECORD NUMBER    Left thigh laceration.  Area was clean and closed with Dermabond.  Patient given discharge care instructions and advised to return back if the wound reopens for healing is complete.      ____________________________________________   FINAL CLINICAL IMPRESSION(S) / ED DIAGNOSES  Final diagnoses:  Laceration of left thigh, initial encounter     ED Discharge Orders    None       Note:  This document was prepared using Dragon voice recognition software and may include unintentional dictation errors.    Joni Reining, PA-C 08/30/18 0109    Charlynne Pander, MD 08/30/18 1140
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
## 2019-02-18 ENCOUNTER — Other Ambulatory Visit: Payer: Self-pay

## 2019-02-18 ENCOUNTER — Emergency Department
Admission: EM | Admit: 2019-02-18 | Discharge: 2019-02-18 | Disposition: A | Discharge status: Home / Self Care | Payer: Self-pay | Attending: Emergency Medicine | Admitting: Emergency Medicine

## 2019-02-18 ENCOUNTER — Emergency Department: Payer: Self-pay

## 2019-02-18 DIAGNOSIS — N611 Abscess of the breast and nipple: Secondary | ICD-10-CM | POA: Insufficient documentation

## 2019-02-18 DIAGNOSIS — F17210 Nicotine dependence, cigarettes, uncomplicated: Secondary | ICD-10-CM | POA: Insufficient documentation

## 2019-02-18 DIAGNOSIS — L0390 Cellulitis, unspecified: Secondary | ICD-10-CM | POA: Insufficient documentation

## 2019-02-18 LAB — BASIC METABOLIC PANEL
Anion gap: 7 (ref 5–15)
BUN: 7 mg/dL (ref 6–20)
CALCIUM: 9.1 mg/dL (ref 8.9–10.3)
CHLORIDE: 104 mmol/L (ref 98–111)
CO2: 26 mmol/L (ref 22–32)
CREATININE: 0.54 mg/dL (ref 0.44–1.00)
GFR calc Af Amer: >60 mL/min (ref >60)
GFR calc non Af Amer: >60 mL/min (ref >60)
GLUCOSE: 103 mg/dL — AB (ref 70–99)
Potassium: 3.4 mmol/L — ABNORMAL LOW (ref 3.5–5.1)
Sodium: 137 mmol/L (ref 135–145)

## 2019-02-18 LAB — CBC
HEMATOCRIT: 37.1 % (ref 36.0–46.0)
Hemoglobin: 11.3 g/dL — ABNORMAL LOW (ref 12.0–15.0)
MCH: 22.8 pg — ABNORMAL LOW (ref 26.0–34.0)
MCHC: 30.5 g/dL (ref 30.0–36.0)
MCV: 74.9 fL — AB (ref 80.0–100.0)
Platelets: 317 10*3/uL (ref 150–400)
RBC: 4.95 MIL/uL (ref 3.87–5.11)
RDW: 17.2 % — AB (ref 11.5–15.5)
WBC: 10.6 10*3/uL — ABNORMAL HIGH (ref 4.0–10.5)
nRBC: 0 % (ref 0.0–0.2)

## 2019-02-18 MED ORDER — CLINDAMYCIN HCL 150 MG PO CAPS
300.0000 mg | ORAL_CAPSULE | Freq: Once | ORAL | Status: AC
  Administered 2019-02-18: 300 mg via ORAL
  Filled 2019-02-18: qty 2

## 2019-02-18 MED ORDER — TETRACAINE HCL 0.5 % OP SOLN: 1.0000 [drp] | Freq: Once | OPHTHALMIC | Status: DC

## 2019-02-18 MED ORDER — CLINDAMYCIN HCL 300 MG PO CAPS: 300.0000 mg | ORAL_CAPSULE | Freq: Three times a day (TID) | ORAL | 0 refills | Status: AC

## 2019-02-18 NOTE — ED Notes (Signed)
Patient in bed alert and oriented. Will continue to monitor. Currently awaiting ultrasound results at this time.

## 2019-02-18 NOTE — ED Notes (Signed)
EDP in with patient 

## 2019-02-18 NOTE — ED Provider Notes (Signed)
[facility] Emergency Department Provider Note  Time seen: 5:21 PM  I have reviewed the triage vital signs and the nursing notes.   HISTORY  Chief Complaint Abscess    HPI Dana Mckinney is a 33 y.o. female with no significant past medical history presents to the emergency department for left breast tenderness and mild amount of drainage.  According to the patient below the left nipple/in the left breast fold there is moderate amount of tenderness induration with a small amount of drainage.  States over the past month or so she noticed a small bump to this area states it has continued to grow but wholly has become painful over the past week with redness and today noticed a small amount of drainage.  Denies any fever.  No nausea or vomiting.  Patient does state a history of cellulitis to the right breast previously.  No breast-feeding.  No nipple discharge.   History reviewed. No pertinent past medical history.  There are no active problems to display for this patient.   Past Surgical History:  Procedure Laterality Date  . CHOLECYSTECTOMY      Prior to Admission medications   Medication Sig Start Date End Date Taking? Authorizing Provider  clindamycin (CLEOCIN) 300 MG capsule Take 300 mg by mouth 3 (three) times daily.    [provider]    No Known Allergies  No family history on file.  Social History Social History   Tobacco Use  . Smoking status: Current Every Day Smoker    Types: Cigarettes  . Smokeless tobacco: Never Used  Substance Use Topics  . Alcohol use: No  . Drug use: Not on file    Review of Systems Constitutional: Negative for fever. Cardiovascular: Negative for chest pain. Respiratory: Negative for shortness of breath. Gastrointestinal: Negative for abdominal pain, vomiting Musculoskeletal: Negative for musculoskeletal complaints Skin: Tenderness and redness of the left breast Neurological: Negative for headache All  other ROS negative  ____________________________________________   PHYSICAL EXAM:  VITAL SIGNS: ED Triage Vitals  Enc Vitals Group     BP 02/18/19 1453 (!) 150/77     Pulse Rate 02/18/19 1453 98     Resp --      Temp 02/18/19 1453 98.3 F (36.8 C)     Temp Source 02/18/19 1453 Oral     SpO2 02/18/19 1453 98 %     Weight 02/18/19 1454 253 lb (114.8 kg)     Height 02/18/19 1454 5\' 11"  (1.803 m)     Head Circumference --      Peak Flow --      Pain Score 02/18/19 1458 10     Pain Loc --      Pain Edu? --      Excl. in GC? --    Constitutional: Alert and oriented. Well appearing and in no distress. Eyes: Normal exam ENT   Head: Normocephalic and atraumatic   Mouth/Throat: Mucous membranes are moist. Cardiovascular: Normal rate, regular rhythm.  Respiratory: Normal respiratory effort without tachypnea nor retractions. Breath sounds are clear.  Mild induration and tenderness just below the left breast/in the left breast fold. Gastrointestinal: Soft and nontender. No distention.   Musculoskeletal: Nontender with normal range of motion in all extremities.  Neurologic:  Normal speech and language. No gross focal neurologic deficits  Skin:  Skin is warm, erythema with small palpable bump below the left breast, with a approximately 4 to 5 cm diameter area of induration surrounding this with  mild tenderness. Psychiatric: Mood and affect are normal. Speech and behavior are normal.     RADIOLOGY  Ultrasound consistent with early developing abscess.  Induration consistent with cellulitis.  ____________________________________________   INITIAL IMPRESSION / ASSESSMENT AND PLAN / ED COURSE  Pertinent labs & imaging results that were available during my care of the patient were reviewed by me and considered in my medical decision making (see chart for details).  Patient presents to the emergency department for tenderness and redness to the left breast.  There is area  approximately 5 x 5 cm of induration with a small area approximately 1 cm that could possibly be an abscess.  Suspect cellulitis with or without a small abscess.  Patient's lab work is reassuring very minimal white blood cell count elevation.  No fever.  No other concerning findings.  No nipple discharge.  We will attempt to obtain a breast ultrasound to further evaluate.  Patient will likely require antibiotics.  Dyspnea most consistent with cellulitis, possibly early/developing abscess although does not appear drainable at this time.  We will place the patient on clindamycin 3 times daily x10 days.  We will refer to the breast center for follow-up.  I discussed this with the patient including breast and her follow-up in the next 2 days.  Patient agreeable to plan of care.  ____________________________________________   FINAL CLINICAL IMPRESSION(S) / ED DIAGNOSES  Left breast cellulitis   Minna Antis, MD 02/18/19 Corky Crafts

## 2019-02-18 NOTE — ED Triage Notes (Signed)
Pt comes via POV from home with c/o redness and swelling noted to left breast.  Pt has redness, swelling noted under left breast below nipple.  No drainage noted  Pt states it was a small bump about a month ago and has continue to grow. Pt states it has became really bad over the last week.

## 2019-02-18 NOTE — Discharge Instructions (Addendum)
[facility] [address]  [place],  [place]  [address]  Main: [phone]   Please take your antibiotics as prescribed.  Please call the number provided for the breast center to arrange a follow-up appointment this week for recheck/reevaluation.  Return to the emergency department for any worsening pain, development of fever, or any other symptom personally concerning to yourself.

## 2021-02-16 IMAGING — US US BREAST*L* LIMITED INC AXILLA
1 series · 13 of 19 positions shown · non-contrast
Comparison: Previous exam(s).

CLINICAL DATA: Evaluate for left breast abscess.

EXAM:
ULTRASOUND OF THE LEFT BREAST

[Series 1: us breast*left* limited inc axilla · 19 acquisitions, 13 frames shown]
[im 1/19]
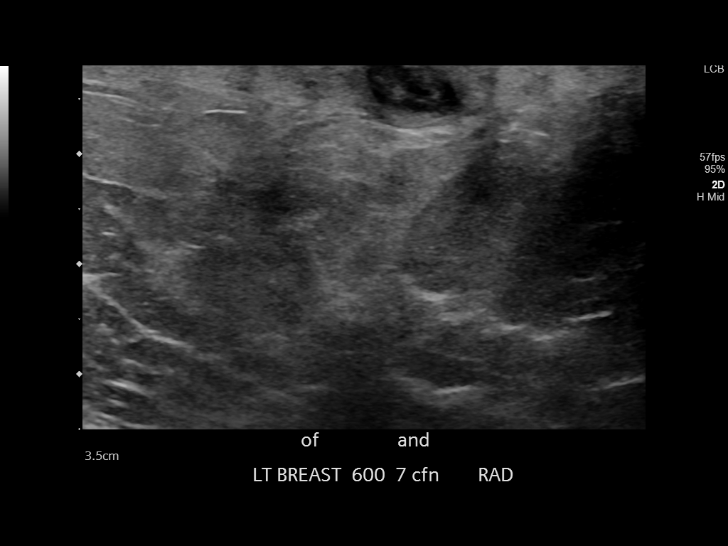
[im 3/19]
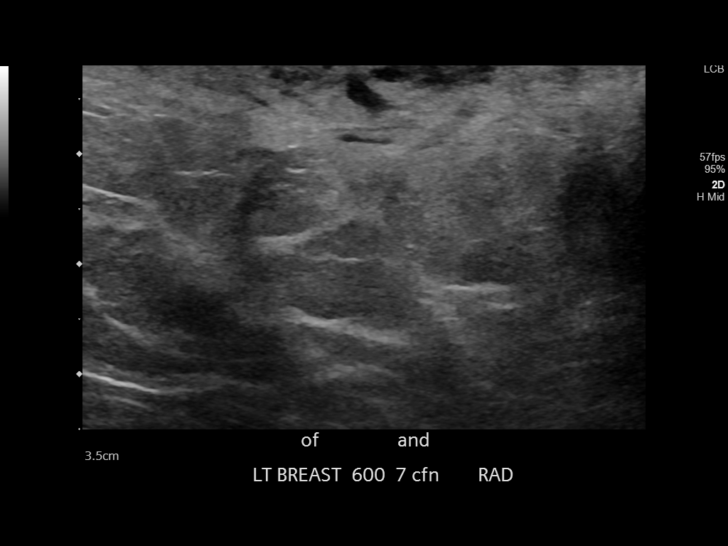
[im 4/19]
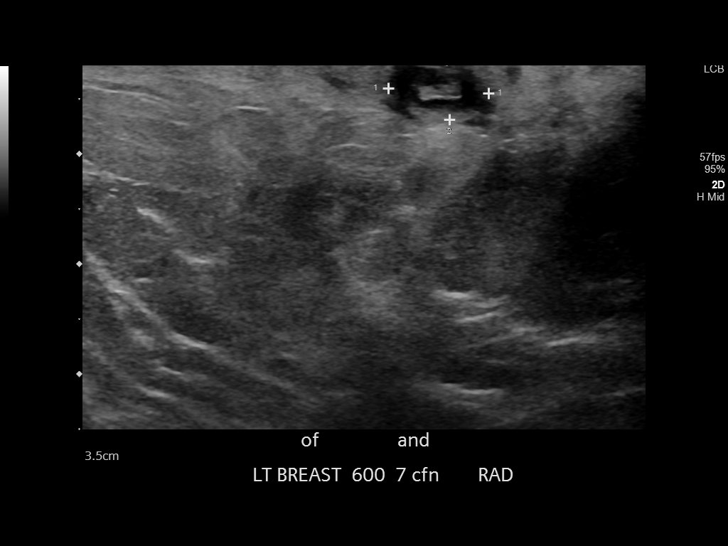
[im 6/19]
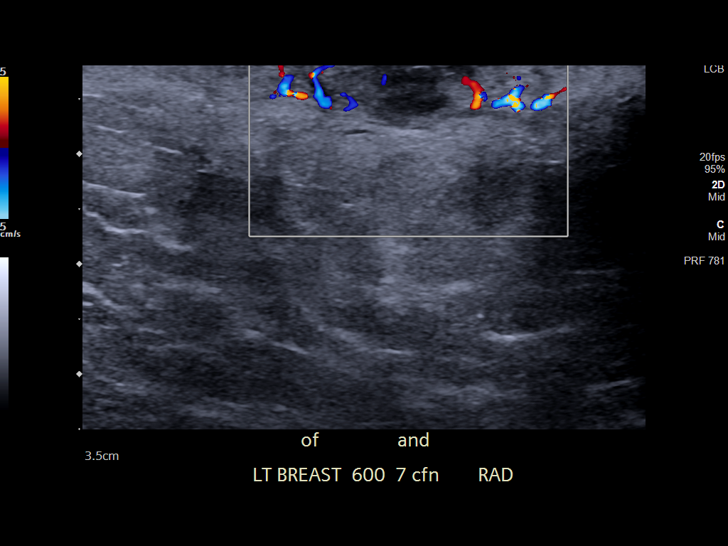
[im 7/19]
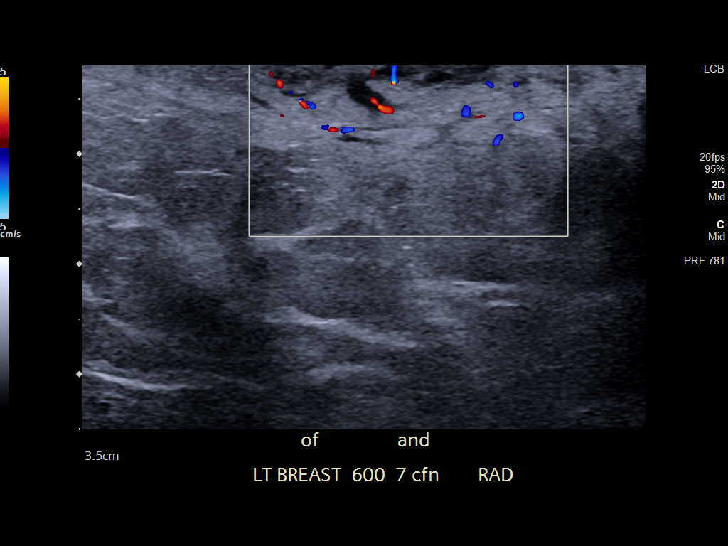
[im 9/19]
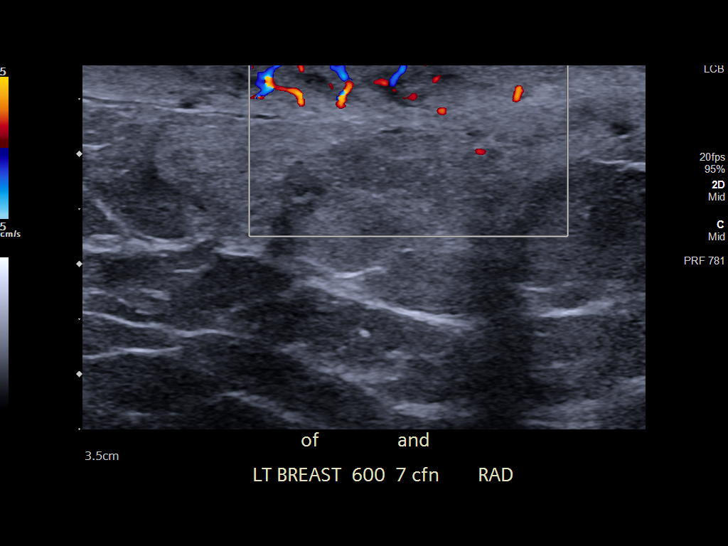
[im 10/19]
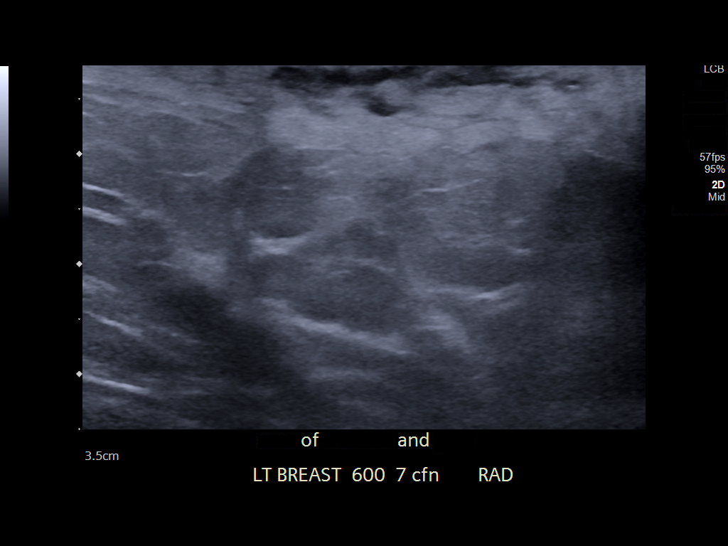
[im 11/19]
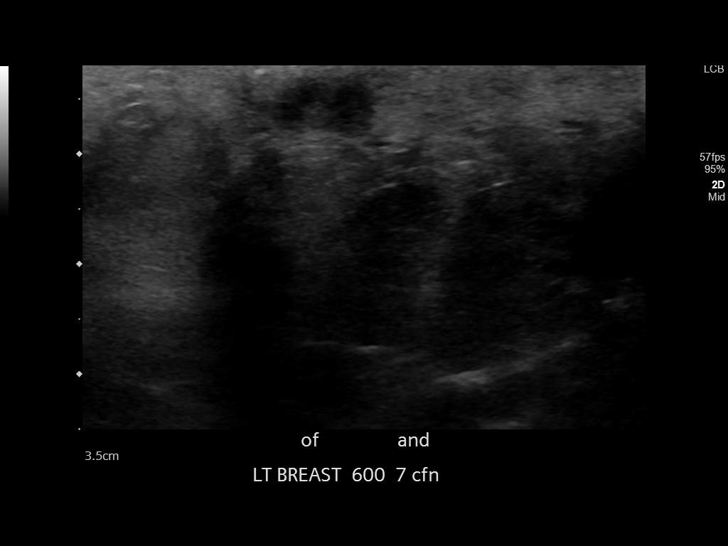
[im 13/19]
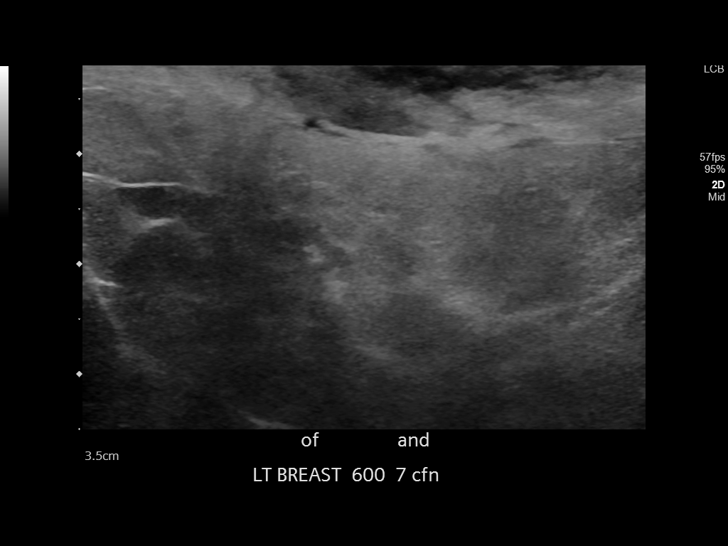
[im 14/19]
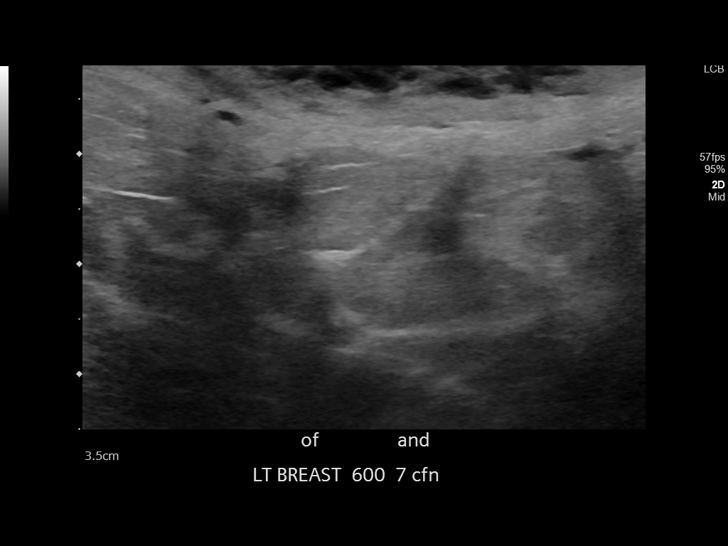
[im 16/19]
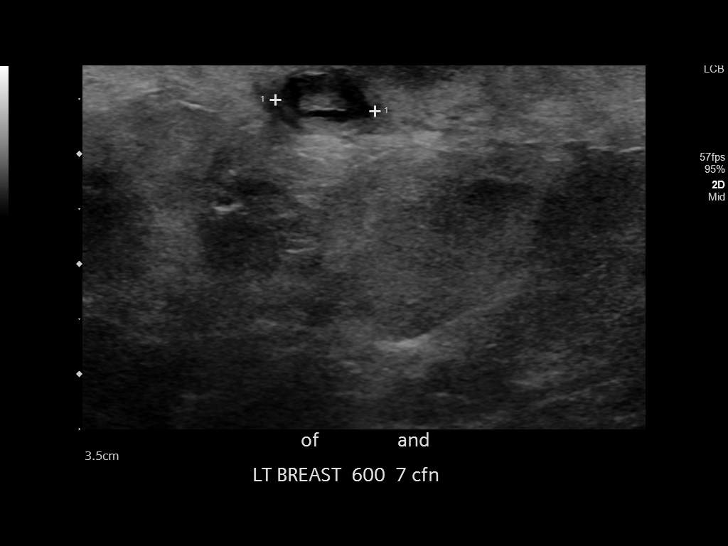
[im 17/19]
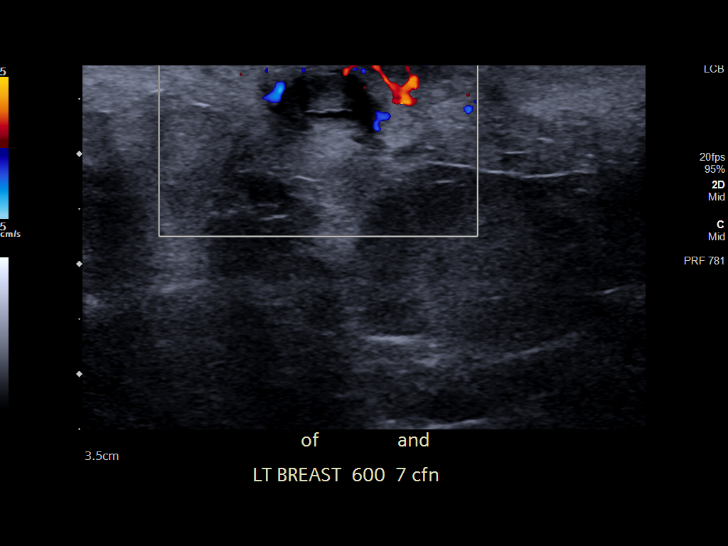
[im 19/19]
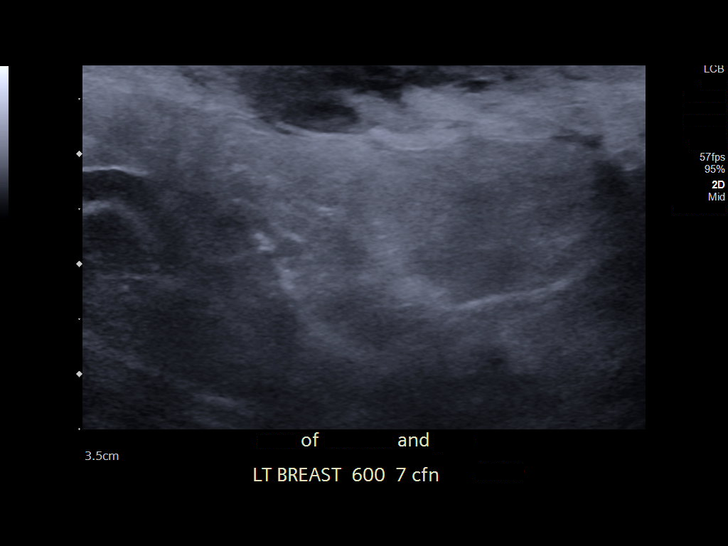

[13 of 19 positions shown; findings below may reference images not displayed]

FINDINGS: Targeted ultrasound is performed, showing skin thickening in the
region of the patient's symptoms. There are patchy hypoechoic and
near anechoic regions which appear to be primarily located in the
thickened skin. The most focal region measures 9 x 5 x 9 mm. The
entire region appears larger but was not measured. There is
associated hyperemia.
IMPRESSION: Skin thickening in the region of the patient's symptoms. There are
also mixed hypoechoic and near anechoic regions which are thought to
be primarily located within the thickened skin. Given symptoms, the
findings may represent developing abscess although the location of
within the skin is somewhat unusual. The findings are less discrete
than would typically be seen with a sebaceous cyst or inclusion
cyst.

RECOMMENDATION:
Possible developing abscess located primarily within the skin.
Recommend treatment with antibiotics. The patient should be seen in
the [REDACTED] tomorrow for further evaluation.

I have discussed the findings and recommendations with the patient.
Results were also provided in writing at the conclusion of the
visit. If applicable, a reminder letter will be sent to the patient
regarding the next appointment.

BI-RADS CATEGORY  3: Probably benign.

## 2022-06-01 ENCOUNTER — Encounter: Payer: Self-pay | Admitting: Emergency Medicine

## 2022-06-01 ENCOUNTER — Ambulatory Visit
Admission: EM | Admit: 2022-06-01 | Discharge: 2022-06-01 | Disposition: A | Discharge status: Home / Self Care | Payer: BC Managed Care – PPO | Attending: Nurse Practitioner | Admitting: Nurse Practitioner

## 2022-06-01 DIAGNOSIS — K047 Periapical abscess without sinus: Secondary | ICD-10-CM | POA: Diagnosis not present

## 2022-06-01 MED ORDER — AMOXICILLIN 500 MG PO CAPS: 500.0000 mg | ORAL_CAPSULE | Freq: Three times a day (TID) | ORAL | 0 refills | Status: AC

## 2022-06-01 NOTE — Discharge Instructions (Addendum)
Dana Mckinney  This is your after visit summary for today.   Dental Abscess start Amoxicillin 500 mg three times a day for 10 days. Please continue as directed. Please keep your dental appointment set for July 2023.  Please feel free to call our office, if your symptoms persist or get worse or if you have any questions. Have a good day. Thad Ranger NP

## 2022-06-01 NOTE — ED Triage Notes (Signed)
Pt reports right lower dental pain x 5 days. States she has an abscessed tooth. States tried salt water gargles, ibuprofen and oragel for relief.
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
## 2023-08-03 ENCOUNTER — Encounter: Payer: Self-pay | Admitting: *Deleted

## 2023-08-03 ENCOUNTER — Emergency Department
Admission: EM | Admit: 2023-08-03 | Discharge: 2023-08-03 | Disposition: A | Discharge status: Home / Self Care | Payer: Self-pay | Attending: Emergency Medicine | Admitting: Emergency Medicine

## 2023-08-03 ENCOUNTER — Other Ambulatory Visit: Payer: Self-pay

## 2023-08-03 DIAGNOSIS — K029 Dental caries, unspecified: Secondary | ICD-10-CM | POA: Insufficient documentation

## 2023-08-03 MED ORDER — LIDOCAINE VISCOUS HCL 2 % MT SOLN
15.0000 mL | Freq: Once | OROMUCOSAL | Status: AC
  Administered 2023-08-03: 15 mL via OROMUCOSAL
  Filled 2023-08-03: qty 15

## 2023-08-03 MED ORDER — AMOXICILLIN 875 MG PO TABS: 875.0000 mg | ORAL_TABLET | Freq: Two times a day (BID) | ORAL | 0 refills | Status: AC

## 2023-08-03 MED ORDER — HYDROCODONE-ACETAMINOPHEN 5-325 MG PO TABS: 1.0000 | ORAL_TABLET | Freq: Four times a day (QID) | ORAL | 0 refills | Status: AC | PRN

## 2023-08-03 MED ORDER — MAGIC MOUTHWASH W/LIDOCAINE: 5.0000 mL | Freq: Four times a day (QID) | ORAL | 0 refills | Status: AC

## 2023-08-03 NOTE — ED Provider Notes (Signed)
[facility] Emergency Department Provider Note     Event Date/Time   First MD Initiated Contact with Patient 08/03/23 1951     (approximate)   History   Dental Pain   HPI  Dana Mckinney is a 37 y.o. female who presents to the ED with complaint of dental pain.  Patient has a history of chronic dental pain.  She endorses poor dental hygiene.  She does not have a primary dentist at this time due to lack of insurance.  Patient is complaining of localized right upper jaw pain for the past couple days. No trauma.  She has tried Tylenol with minimal relief.  Denies fever, chills and jaw pain.   Medical history is non contributory.      Physical Exam   Triage Vital Signs: ED Triage Vitals  Encounter Vitals Group     BP 08/03/23 1935 (!) 173/92     Systolic BP Percentile --      Diastolic BP Percentile --      Pulse Rate 08/03/23 1935 (!) 108     Resp 08/03/23 1935 16     Temp 08/03/23 1935 98.3 F (36.8 C)     Temp Source 08/03/23 1935 Oral     SpO2 08/03/23 1935 98 %     Weight --      Height --      Head Circumference --      Peak Flow --      Pain Score 08/03/23 1935 9     Pain Loc --      Pain Education --      Exclude from Growth Chart --     Most recent vital signs: Vitals:   08/03/23 2107 08/03/23 2118  BP: (!) 141/89 136/85  Pulse: (!) 102   Resp: 18   Temp: 98.6 F (37 C)   SpO2: 94%     General: Awake, no distress.  Normal speech.  Tolerating oral secretions. HEENT: NCAT. No cervical or facial swelling.  CV:  Good peripheral perfusion.  RESP:  Normal effort.  ABD:  No distention.  ORAL CAVITY: Multiple dental caries.  Poor dentition.  Absent enamel. Tooth decay on all teeth. TTP over right gumline.  No abscess or masses noted.    ED Results / Procedures / Treatments   Labs (all labs ordered are listed, but only abnormal results are displayed) Labs Reviewed - No data to display  No results  found.  PROCEDURES:  Critical Care performed: No  Procedures  MEDICATIONS ORDERED IN ED: Medications  lidocaine (XYLOCAINE) 2 % viscous mouth solution 15 mL (15 mLs Mouth/Throat Given 08/03/23 2015)   IMPRESSION / MDM / ASSESSMENT AND PLAN / ED COURSE  I reviewed the triage vital signs and the nursing notes.                               37 y.o. female presents to the emergency department for evaluation and treatment of acute on chronic dental pain. See HPI for further details.   Differential diagnosis includes, but is not limited to ludwigs angina, dental caries, pulpitis, dental abscess.    Patient's presentation is most consistent with acute illness / injury with system symptoms.  Physical exam findings are pertinent for generalized dental decay and poor dentition.  Patient administered lidocaine viscous in the ED.  Reports improvement in pain.  Patient will be discharged home with pain  management and mouthwash in addition to antibiotic to prevent infection.  Patient is to closely monitor with a dentist.  A list is provided for her.  Patient is given ED precautions to return to the ED for any worsening or new symptoms. Patient verbalizes understanding. All questions and concerns were addressed during ED visit.     FINAL CLINICAL IMPRESSION(S) / ED DIAGNOSES   Final diagnoses:  Pain due to dental caries  Infected dental caries    Rx / DC Orders   ED Discharge Orders          Ordered    HYDROcodone-acetaminophen (NORCO/VICODIN) 5-325 MG tablet  Every 6 hours PRN        08/03/23 2103    magic mouthwash w/lidocaine SOLN  4 times daily        08/03/23 2103    amoxicillin (AMOXIL) 875 MG tablet  2 times daily        08/03/23 2103             Note:  This document was prepared using Dragon voice recognition software and may include unintentional dictation errors.    Romeo Apple, Jorden Minchey A, PA-C 08/04/23 Riley Kill    Shaune Pollack, MD 08/04/23 1324

## 2023-08-03 NOTE — ED Triage Notes (Signed)
Pt is here for right lower dental pain which began yesterday.  Pain intolerable
